# Patient Record
Sex: Male | Born: 1955 | Race: Black or African American | Hispanic: No | Marital: Married | Smoking: Former smoker
Health system: Southern US, Community
[De-identification: ages and names within clinical notes are randomized; demographics above are authoritative.]

## PROBLEM LIST (undated history)

## (undated) DIAGNOSIS — I10 Essential (primary) hypertension: Secondary | ICD-10-CM

## (undated) DIAGNOSIS — M1990 Unspecified osteoarthritis, unspecified site: Secondary | ICD-10-CM

## (undated) HISTORY — PX: VASECTOMY: SHX75

---

## 2004-06-26 ENCOUNTER — Emergency Department (HOSPITAL_COMMUNITY): Admission: EM | Admit: 2004-06-26 | Discharge: 2004-06-26 | Payer: Self-pay | Admitting: Emergency Medicine

## 2009-03-16 ENCOUNTER — Emergency Department (HOSPITAL_COMMUNITY): Admission: EM | Admit: 2009-03-16 | Discharge: 2009-03-16 | Payer: Self-pay | Admitting: Emergency Medicine

## 2009-03-23 ENCOUNTER — Emergency Department (HOSPITAL_COMMUNITY): Admission: EM | Admit: 2009-03-23 | Discharge: 2009-03-23 | Payer: Self-pay | Admitting: Emergency Medicine

## 2010-08-05 LAB — DIFFERENTIAL
Basophils Relative: 0 % (ref 0–1)
Eosinophils Absolute: 0 10*3/uL (ref 0.0–0.7)
Monocytes Absolute: 1 10*3/uL (ref 0.1–1.0)
Neutrophils Relative %: 83 % — ABNORMAL HIGH (ref 43–77)

## 2010-08-05 LAB — CSF CELL COUNT WITH DIFFERENTIAL
RBC Count, CSF: 444 /mm3 — ABNORMAL HIGH
Tube #: 1
WBC, CSF: 6 /mm3 — ABNORMAL HIGH (ref 0–5)

## 2010-08-05 LAB — CSF CULTURE W GRAM STAIN
Culture: NO GROWTH
Gram Stain: NONE SEEN

## 2010-08-05 LAB — BASIC METABOLIC PANEL
Calcium: 8.6 mg/dL (ref 8.4–10.5)
Creatinine, Ser: 1.39 mg/dL (ref 0.4–1.5)
GFR calc Af Amer: >60 mL/min (ref >60)
GFR calc non Af Amer: 54 mL/min — ABNORMAL LOW (ref >60)
Glucose, Bld: 131 mg/dL — ABNORMAL HIGH (ref 70–99)
Potassium: 3.8 mEq/L (ref 3.5–5.1)
Sodium: 128 mEq/L — ABNORMAL LOW (ref 135–145)

## 2010-08-05 LAB — URINALYSIS, ROUTINE W REFLEX MICROSCOPIC
Bilirubin Urine: NEGATIVE
Glucose, UA: NEGATIVE mg/dL
Hgb urine dipstick: NEGATIVE
Nitrite: NEGATIVE
Protein, ur: NEGATIVE mg/dL
Specific Gravity, Urine: 1.01 (ref 1.005–1.030)
pH: 7 (ref 5.0–8.0)

## 2010-08-05 LAB — CBC
HCT: 43.6 % (ref 39.0–52.0)
Hemoglobin: 15 g/dL (ref 13.0–17.0)
RDW: 13.3 % (ref 11.5–15.5)
WBC: 12.3 10*3/uL — ABNORMAL HIGH (ref 4.0–10.5)

## 2010-08-05 LAB — GRAM STAIN: Gram Stain: NONE SEEN

## 2010-08-05 LAB — PROTEIN, CSF: Total  Protein, CSF: 25 mg/dL (ref 15–45)

## 2012-04-10 ENCOUNTER — Emergency Department (HOSPITAL_COMMUNITY)
Admission: EM | Admit: 2012-04-10 | Discharge: 2012-04-11 | Disposition: A | Discharge status: Home / Self Care | Payer: No Typology Code available for payment source | Attending: Emergency Medicine | Admitting: Emergency Medicine

## 2012-04-10 ENCOUNTER — Emergency Department (HOSPITAL_COMMUNITY): Payer: No Typology Code available for payment source

## 2012-04-10 ENCOUNTER — Encounter (HOSPITAL_COMMUNITY): Payer: Self-pay | Admitting: *Deleted

## 2012-04-10 DIAGNOSIS — M129 Arthropathy, unspecified: Secondary | ICD-10-CM | POA: Diagnosis not present

## 2012-04-10 DIAGNOSIS — M79609 Pain in unspecified limb: Secondary | ICD-10-CM | POA: Insufficient documentation

## 2012-04-10 DIAGNOSIS — I10 Essential (primary) hypertension: Secondary | ICD-10-CM | POA: Insufficient documentation

## 2012-04-10 DIAGNOSIS — M792 Neuralgia and neuritis, unspecified: Secondary | ICD-10-CM

## 2012-04-10 DIAGNOSIS — M25519 Pain in unspecified shoulder: Secondary | ICD-10-CM | POA: Diagnosis present

## 2012-04-10 HISTORY — DX: Essential (primary) hypertension: I10

## 2012-04-10 HISTORY — DX: Unspecified osteoarthritis, unspecified site: M19.90

## 2012-04-10 MED ORDER — LORAZEPAM 1 MG PO TABS
1.0000 mg | ORAL_TABLET | Freq: Once | ORAL | Status: AC
  Administered 2012-04-11: 1 mg via ORAL
  Filled 2012-04-10: qty 1

## 2012-04-10 MED ORDER — HYDROCODONE-ACETAMINOPHEN 5-325 MG PO TABS
1.0000 | ORAL_TABLET | Freq: Once | ORAL | Status: DC
  Filled 2012-04-10: qty 1

## 2012-04-10 NOTE — ED Provider Notes (Signed)
History     CSN: [id number]  Arrival date & time 04/10/12  2111   First MD Initiated Contact with Patient 04/10/12 2312      No chief complaint on file.   (Consider location/radiation/quality/duration/timing/severity/associated sxs/prior treatment) Patient is a 56 y.o. male presenting with extremity pain. The history is provided by the patient.  Extremity Pain This is a new (new onset of upper right arm pain between the elbow and shoulder ) problem. The current episode started today. The problem occurs intermittently. The problem has been gradually worsening. Pertinent negatives include no abdominal pain, arthralgias, chest pain, fever, headaches, joint swelling, neck pain or numbness. Exacerbated by: the pain is aggravated by lowering the arm below 90 degrees  flexion or abduction. He has tried immobilization (the symptoms seem to dissipate when the arm is flexed abvove the head) for the symptoms.    Past Medical History  Diagnosis Date  . Hypertension   . Arthritis     History reviewed. No pertinent past surgical history.  No family history on file.  History  Substance Use Topics  . Smoking status: Never Smoker   . Smokeless tobacco: Not on file  . Alcohol Use: No      Review of Systems  Constitutional: Negative for fever.  HENT: Negative for neck pain.   Respiratory: Negative for chest tightness and shortness of breath.   Cardiovascular: Negative for chest pain.  Gastrointestinal: Negative for abdominal pain.  Musculoskeletal: Negative for back pain, joint swelling and arthralgias.       See HPI for arm symptom description.  Neurological: Negative for numbness and headaches.    Allergies  Review of patient's allergies indicates no known allergies.  Home Medications   Current Outpatient Rx  Name  Route  Sig  Dispense  Refill  . ACETAMINOPHEN 500 MG PO TABS   Oral   Take 1,000 mg by mouth every 6 (six) hours as needed. For pain.         Marland Kitchen  HYDROCHLOROTHIAZIDE 25 MG PO TABS   Oral   Take 25 mg by mouth daily.           BP 137/78  Pulse 82  Temp 97.6 F (36.4 C) (Oral)  Resp 20  SpO2 100%  Physical Exam  Constitutional: He appears well-developed and well-nourished.  Neck: Normal range of motion. Muscular tenderness present.       Tenderness upon palpation left of center cervical spine.  Cardiovascular: Normal rate and regular rhythm.   Pulses:      Radial pulses are 2+ on the left side.  Pulmonary/Chest: Breath sounds normal.  Musculoskeletal:       Left upper arm: He exhibits tenderness. He exhibits no swelling and no deformity.       He exhibits no neurological deficits in the left arm.  No muscle weakness noted.    ED Course  Procedures (including critical care time)  Labs Reviewed - No data to display Dg Shoulder Left  04/10/2012  *RADIOLOGY REPORT*  Clinical Data: Left shoulder pain.  No injury.  The patient is unable to lower the arm due to pain.  LEFT SHOULDER - 2+ VIEW  Comparison: None.  Findings: Two-view study obtained with limited positioning.  As visualized, there is no evidence of acute fracture or subluxation. No focal bone lesion or bone destruction.  Bone cortex and trabecular architecture appear intact.  IMPRESSION: Nonstandard imaging projections of the left shoulder demonstrate no gross evidence of acute fracture or disc  Original Report Authenticated By: Burman Nieves, M.D.      No diagnosis found. 1. Cervical radiculopathy   MDM  Patient presents with non musculoskeletal pain in his left upper arm with movement.  The pain is intermittent and described as 15/10.  When the arm is flexed above his head, the pain dissipates.  The patient struggles to find a position that alleviates the pain.  He states that Saturday, he felt a "twinge" of this pain, but Monday morning the pain exacerbated without warning.  He describes the pain as a charlie horse or funny bone feeling.   The pain follows a  radicular type pattern in the left arm from the shoulder to the wrist distally.  Patient given dilaudid and lorazepam in ED, which helped reduce his pain symptoms.  He will be discharged on percocet and prednisone with a referral to orthopedic surgeon and/or neurosurgery.  He should follow up with one of these referrals as soon as possible.  The patient acknowledges and agrees with this treatment plan.        Edna Rede, PA-C 04/11/12 0125

## 2012-04-10 NOTE — ED Notes (Signed)
Pt c/o left shoulder pain since Saturday; no known injury; sitting with arm above his head stating this is only comfortable position

## 2012-04-11 DIAGNOSIS — M79609 Pain in unspecified limb: Secondary | ICD-10-CM | POA: Diagnosis not present

## 2012-04-11 MED ORDER — PREDNISONE 20 MG PO TABS: ORAL_TABLET | ORAL | Status: DC

## 2012-04-11 MED ORDER — PREDNISONE 20 MG PO TABS
60.0000 mg | ORAL_TABLET | Freq: Once | ORAL | Status: AC
  Administered 2012-04-11: 60 mg via ORAL
  Filled 2012-04-11: qty 3

## 2012-04-11 MED ORDER — HYDROMORPHONE HCL PF 2 MG/ML IJ SOLN
2.0000 mg | Freq: Once | INTRAMUSCULAR | Status: AC
  Administered 2012-04-11: 2 mg via INTRAMUSCULAR
  Filled 2012-04-11: qty 1

## 2012-04-11 MED ORDER — OXYCODONE-ACETAMINOPHEN 5-325 MG PO TABS: 1.0000 | ORAL_TABLET | ORAL | Status: DC | PRN

## 2012-04-11 NOTE — ED Provider Notes (Signed)
Medical screening examination/treatment/procedure(s) were performed by non-physician practitioner and as supervising physician I was immediately available for consultation/collaboration.   Lyanne Co, MD 04/11/12 [phone]

## 2012-04-11 NOTE — ED Notes (Addendum)
Pt noted to be pacing in room with c/o pain.

## 2012-05-29 ENCOUNTER — Other Ambulatory Visit: Payer: Self-pay | Admitting: Neurosurgery

## 2012-05-29 DIAGNOSIS — M542 Cervicalgia: Secondary | ICD-10-CM

## 2012-06-05 ENCOUNTER — Other Ambulatory Visit: Payer: TRICARE For Life (TFL)

## 2012-06-16 ENCOUNTER — Ambulatory Visit (HOSPITAL_COMMUNITY): Admission: RE | Admit: 2012-06-16 | Source: Ambulatory Visit

## 2012-06-22 ENCOUNTER — Ambulatory Visit (HOSPITAL_COMMUNITY)
Admission: RE | Admit: 2012-06-22 | Discharge: 2012-06-22 | Disposition: A | Discharge status: Home / Self Care | Source: Ambulatory Visit | Attending: Neurosurgery | Admitting: Neurosurgery

## 2012-06-22 DIAGNOSIS — M79609 Pain in unspecified limb: Secondary | ICD-10-CM | POA: Insufficient documentation

## 2012-06-22 DIAGNOSIS — M542 Cervicalgia: Secondary | ICD-10-CM | POA: Insufficient documentation

## 2012-06-22 DIAGNOSIS — R29898 Other symptoms and signs involving the musculoskeletal system: Secondary | ICD-10-CM | POA: Insufficient documentation

## 2012-06-22 DIAGNOSIS — M5020 Other cervical disc displacement, unspecified cervical region: Secondary | ICD-10-CM | POA: Insufficient documentation

## 2012-06-22 DIAGNOSIS — M5000 Cervical disc disorder with myelopathy, unspecified cervical region: Secondary | ICD-10-CM | POA: Insufficient documentation

## 2012-06-22 DIAGNOSIS — R209 Unspecified disturbances of skin sensation: Secondary | ICD-10-CM | POA: Insufficient documentation

## 2012-11-23 ENCOUNTER — Other Ambulatory Visit: Payer: Self-pay | Admitting: Neurosurgery

## 2013-02-09 ENCOUNTER — Encounter (HOSPITAL_COMMUNITY): Payer: Self-pay | Admitting: Pharmacy Technician

## 2013-02-12 ENCOUNTER — Encounter (HOSPITAL_COMMUNITY): Payer: Self-pay

## 2013-02-12 ENCOUNTER — Encounter (HOSPITAL_COMMUNITY)
Admission: RE | Admit: 2013-02-12 | Discharge: 2013-02-12 | Disposition: A | Discharge status: Home / Self Care | Source: Ambulatory Visit | Attending: Neurosurgery | Admitting: Neurosurgery

## 2013-02-12 ENCOUNTER — Ambulatory Visit (HOSPITAL_COMMUNITY)
Admission: RE | Admit: 2013-02-12 | Discharge: 2013-02-12 | Disposition: A | Discharge status: Home / Self Care | Source: Ambulatory Visit | Attending: Anesthesiology | Admitting: Anesthesiology

## 2013-02-12 DIAGNOSIS — I10 Essential (primary) hypertension: Secondary | ICD-10-CM | POA: Insufficient documentation

## 2013-02-12 DIAGNOSIS — Z01810 Encounter for preprocedural cardiovascular examination: Secondary | ICD-10-CM | POA: Insufficient documentation

## 2013-02-12 DIAGNOSIS — Z01812 Encounter for preprocedural laboratory examination: Secondary | ICD-10-CM | POA: Insufficient documentation

## 2013-02-12 DIAGNOSIS — Z01818 Encounter for other preprocedural examination: Secondary | ICD-10-CM | POA: Insufficient documentation

## 2013-02-12 DIAGNOSIS — R918 Other nonspecific abnormal finding of lung field: Secondary | ICD-10-CM | POA: Insufficient documentation

## 2013-02-12 LAB — SURGICAL PCR SCREEN
MRSA, PCR: NEGATIVE
Staphylococcus aureus: NEGATIVE

## 2013-02-12 LAB — BASIC METABOLIC PANEL
BUN: 12 mg/dL (ref 6–23)
Calcium: 9 mg/dL (ref 8.4–10.5)
Creatinine, Ser: 1.12 mg/dL (ref 0.50–1.35)
GFR calc Af Amer: 83 mL/min — ABNORMAL LOW (ref >90)
GFR calc non Af Amer: 72 mL/min — ABNORMAL LOW (ref >90)

## 2013-02-12 LAB — CBC
MCHC: 33.2 g/dL (ref 30.0–36.0)
Platelets: 216 10*3/uL (ref 150–400)
RBC: 4.71 MIL/uL (ref 4.22–5.81)
RDW: 13.3 % (ref 11.5–15.5)

## 2013-02-12 NOTE — Pre-Procedure Instructions (Signed)
Alexander Rangel  02/12/2013   Your procedure is scheduled on:  October 23  Report to [facility] Entrance "A" [address] at [employer] AM.  Call this number if you have problems the morning of surgery: [phone]   Remember:   Do not eat food or drink liquids after midnight.   Take these medicines the morning of surgery with A SIP OF WATER: None   STOP Aspirin and Multiple Vitamins October 16  Do not take Aspirin, Aleve, Naproxen, Advil, Ibuprofen, Vitamin, Herbs, or Supplements starting today  Do not wear jewelry, make-up or nail polish.  Do not wear lotions, powders, or perfumes. You may wear deodorant.  Do not shave 48 hours prior to surgery. Men may shave face and neck.  Do not bring valuables to the hospital.  [facility] is not responsible                  for any belongings or valuables.               Contacts, dentures or bridgework may not be worn into surgery.  Leave suitcase in the car. After surgery it may be brought to your room.  For patients admitted to the hospital, discharge time is determined by your                treatment team.               Special Instructions: Shower using CHG 2 nights before surgery and the night before surgery.  If you shower the day of surgery use CHG.  Use special wash - you have one bottle of CHG for all showers.  You should use approximately 1/3 of the bottle for each shower.   Please read over the following fact sheets that you were given: Pain Booklet, Coughing and Deep Breathing and Surgical Site Infection Prevention

## 2013-02-12 NOTE — Progress Notes (Signed)
Requested Stress test, echo and cardiac records from [facility].

## 2013-02-13 NOTE — Progress Notes (Signed)
Anesthesia Chart Review:  Patient is a 57 year old male scheduled for C5-6, C6-7 ACDF on 02/22/13 by Dr. Lovell Sheehan.  History includes former smoker, HTN, arthritis, vasectomy.  EKG on 02/12/13 showed NSR.  Cardiac testing results reviewed for the [facility]. On 05/25/12 he had a normal treadmill stress EKG.  On 10/27/11 he had an echocardiogram that showed normal LV size and function, EF 70%. Normal RV size and function. Trace MR.  EKG on 04/18/12 showed NSR, minimal voltage criteria for LVH.  CXR on 02/12/13 showed: 1. No acute cardiopulmonary disease.  2. Indeterminate approximately 1.8 x 0.8 cm nodule overlying the peripheral aspect of the right mid lung may represent a discrete pulmonary nodule or be associated with the adjacent pleura or lateral aspect of the 8th rib. Correlation to prior examinations (if available) is recommended. If no comparison exams, further evaluation with chest CT is recommended.  This report was called to Darl Pikes at Dr. Lovell Sheehan' office.  She will have him review for further recommendations.    Preoperative labs noted.  Defer timing of further evaluation of abnormal CXR to Dr. Lovell Sheehan.  If patient is not having any acute respiratory symptoms then I do not think that it will interfere with proceeding with plans for surgery.  Velna Ochs [facility] Short Stay Center/Anesthesiology Phone [phone] 02/13/2013 3:22 PM

## 2013-02-14 ENCOUNTER — Other Ambulatory Visit (HOSPITAL_COMMUNITY): Payer: Self-pay | Admitting: Neurosurgery

## 2013-02-14 DIAGNOSIS — R911 Solitary pulmonary nodule: Secondary | ICD-10-CM

## 2013-02-16 ENCOUNTER — Ambulatory Visit (HOSPITAL_COMMUNITY)
Admission: RE | Admit: 2013-02-16 | Discharge: 2013-02-16 | Disposition: A | Discharge status: Home / Self Care | Source: Ambulatory Visit | Attending: Neurosurgery | Admitting: Neurosurgery

## 2013-02-16 DIAGNOSIS — R911 Solitary pulmonary nodule: Secondary | ICD-10-CM

## 2013-02-16 DIAGNOSIS — M899 Disorder of bone, unspecified: Secondary | ICD-10-CM | POA: Insufficient documentation

## 2013-02-21 MED ORDER — CEFAZOLIN SODIUM-DEXTROSE 2-3 GM-% IV SOLR
2.0000 g | INTRAVENOUS | Status: AC
  Administered 2013-02-22: 2 g via INTRAVENOUS
  Filled 2013-02-21: qty 50

## 2013-02-22 ENCOUNTER — Encounter (HOSPITAL_COMMUNITY)
Admission: RE | Disposition: A | Discharge status: Home / Self Care | Payer: Self-pay | Source: Ambulatory Visit | Attending: Neurosurgery

## 2013-02-22 ENCOUNTER — Encounter (HOSPITAL_COMMUNITY): Payer: Self-pay | Admitting: *Deleted

## 2013-02-22 ENCOUNTER — Ambulatory Visit (HOSPITAL_COMMUNITY): Payer: No Typology Code available for payment source | Admitting: Anesthesiology

## 2013-02-22 ENCOUNTER — Encounter (HOSPITAL_COMMUNITY): Payer: No Typology Code available for payment source | Admitting: Vascular Surgery

## 2013-02-22 ENCOUNTER — Observation Stay (HOSPITAL_COMMUNITY)
Admission: RE | Admit: 2013-02-22 | Discharge: 2013-02-23 | Disposition: A | Discharge status: Home / Self Care | Payer: No Typology Code available for payment source | Source: Ambulatory Visit | Attending: Neurosurgery | Admitting: Neurosurgery

## 2013-02-22 ENCOUNTER — Ambulatory Visit (HOSPITAL_COMMUNITY): Payer: No Typology Code available for payment source

## 2013-02-22 DIAGNOSIS — G935 Compression of brain: Secondary | ICD-10-CM | POA: Diagnosis not present

## 2013-02-22 DIAGNOSIS — M47812 Spondylosis without myelopathy or radiculopathy, cervical region: Secondary | ICD-10-CM | POA: Diagnosis not present

## 2013-02-22 DIAGNOSIS — M5030 Other cervical disc degeneration, unspecified cervical region: Secondary | ICD-10-CM | POA: Diagnosis not present

## 2013-02-22 DIAGNOSIS — M5020 Other cervical disc displacement, unspecified cervical region: Secondary | ICD-10-CM | POA: Diagnosis not present

## 2013-02-22 DIAGNOSIS — Z79899 Other long term (current) drug therapy: Secondary | ICD-10-CM | POA: Diagnosis not present

## 2013-02-22 DIAGNOSIS — I10 Essential (primary) hypertension: Secondary | ICD-10-CM | POA: Diagnosis not present

## 2013-02-22 DIAGNOSIS — M4712 Other spondylosis with myelopathy, cervical region: Secondary | ICD-10-CM

## 2013-02-22 HISTORY — PX: ANTERIOR CERVICAL DECOMP/DISCECTOMY FUSION: SHX1161

## 2013-02-22 SURGERY — ANTERIOR CERVICAL DECOMPRESSION/DISCECTOMY FUSION 2 LEVELS
Anesthesia: General | Site: Neck | Wound class: Clean

## 2013-02-22 MED ORDER — ONDANSETRON HCL 4 MG/2ML IJ SOLN
INTRAMUSCULAR | Status: DC | PRN
  Administered 2013-02-22: 4 mg via INTRAVENOUS

## 2013-02-22 MED ORDER — LACTATED RINGERS IV SOLN: INTRAVENOUS | Status: DC

## 2013-02-22 MED ORDER — HYDROCODONE-ACETAMINOPHEN 5-325 MG PO TABS: 1.0000 | ORAL_TABLET | ORAL | Status: DC | PRN

## 2013-02-22 MED ORDER — OXYCODONE-ACETAMINOPHEN 5-325 MG PO TABS
1.0000 | ORAL_TABLET | ORAL | Status: DC | PRN
  Administered 2013-02-22 – 2013-02-23 (×4): 2 via ORAL
  Filled 2013-02-22 (×3): qty 2

## 2013-02-22 MED ORDER — DEXAMETHASONE SODIUM PHOSPHATE 4 MG/ML IJ SOLN
INTRAMUSCULAR | Status: DC | PRN
  Administered 2013-02-22: 10 mg via INTRAVENOUS

## 2013-02-22 MED ORDER — PROMETHAZINE HCL 25 MG/ML IJ SOLN: 6.2500 mg | INTRAMUSCULAR | Status: DC | PRN

## 2013-02-22 MED ORDER — EPHEDRINE SULFATE 50 MG/ML IJ SOLN
INTRAMUSCULAR | Status: DC | PRN
  Administered 2013-02-22: 10 mg via INTRAVENOUS

## 2013-02-22 MED ORDER — NEOSTIGMINE METHYLSULFATE 1 MG/ML IJ SOLN
INTRAMUSCULAR | Status: DC | PRN
  Administered 2013-02-22: 3 mg via INTRAVENOUS

## 2013-02-22 MED ORDER — LACTATED RINGERS IV SOLN
INTRAVENOUS | Status: DC | PRN
  Administered 2013-02-22 (×2): via INTRAVENOUS

## 2013-02-22 MED ORDER — FENTANYL CITRATE 0.05 MG/ML IJ SOLN
INTRAMUSCULAR | Status: DC | PRN
  Administered 2013-02-22: 100 ug via INTRAVENOUS
  Administered 2013-02-22: 150 ug via INTRAVENOUS
  Administered 2013-02-22 (×2): 50 ug via INTRAVENOUS

## 2013-02-22 MED ORDER — ACETAMINOPHEN 650 MG RE SUPP: 650.0000 mg | RECTAL | Status: DC | PRN

## 2013-02-22 MED ORDER — HYDROMORPHONE HCL PF 1 MG/ML IJ SOLN
0.2500 mg | INTRAMUSCULAR | Status: DC | PRN
  Administered 2013-02-22 (×2): 0.5 mg via INTRAVENOUS

## 2013-02-22 MED ORDER — GLYCOPYRROLATE 0.2 MG/ML IJ SOLN
INTRAMUSCULAR | Status: DC | PRN
  Administered 2013-02-22: 0.4 mg via INTRAVENOUS

## 2013-02-22 MED ORDER — HEMOSTATIC AGENTS (NO CHARGE) OPTIME
TOPICAL | Status: DC | PRN
  Administered 2013-02-22: 1 via TOPICAL

## 2013-02-22 MED ORDER — HYDROMORPHONE HCL PF 1 MG/ML IJ SOLN
INTRAMUSCULAR | Status: AC
  Administered 2013-02-22: 0.5 mg via INTRAVENOUS
  Filled 2013-02-22: qty 1

## 2013-02-22 MED ORDER — ACETAMINOPHEN 325 MG PO TABS: 650.0000 mg | ORAL_TABLET | ORAL | Status: DC | PRN

## 2013-02-22 MED ORDER — DEXAMETHASONE SODIUM PHOSPHATE 4 MG/ML IJ SOLN
4.0000 mg | Freq: Four times a day (QID) | INTRAMUSCULAR | Status: AC
  Administered 2013-02-22: 4 mg via INTRAVENOUS
  Filled 2013-02-22: qty 1

## 2013-02-22 MED ORDER — BUPIVACAINE-EPINEPHRINE PF 0.5-1:200000 % IJ SOLN
INTRAMUSCULAR | Status: DC | PRN
  Administered 2013-02-22: 10 mL

## 2013-02-22 MED ORDER — SODIUM CHLORIDE 0.9 % IR SOLN
Status: DC | PRN
  Administered 2013-02-22: 08:00:00

## 2013-02-22 MED ORDER — CEFAZOLIN SODIUM-DEXTROSE 2-3 GM-% IV SOLR
2.0000 g | Freq: Three times a day (TID) | INTRAVENOUS | Status: AC
  Administered 2013-02-22 (×2): 2 g via INTRAVENOUS
  Filled 2013-02-22 (×2): qty 50

## 2013-02-22 MED ORDER — DEXAMETHASONE 4 MG PO TABS
4.0000 mg | ORAL_TABLET | Freq: Four times a day (QID) | ORAL | Status: AC
  Administered 2013-02-22 (×2): 4 mg via ORAL
  Filled 2013-02-22 (×2): qty 1

## 2013-02-22 MED ORDER — MEPERIDINE HCL 25 MG/ML IJ SOLN: 6.2500 mg | INTRAMUSCULAR | Status: DC | PRN

## 2013-02-22 MED ORDER — DIAZEPAM 5 MG PO TABS
5.0000 mg | ORAL_TABLET | Freq: Four times a day (QID) | ORAL | Status: DC | PRN
  Administered 2013-02-22 – 2013-02-23 (×3): 5 mg via ORAL
  Filled 2013-02-22 (×2): qty 1

## 2013-02-22 MED ORDER — HYDROCHLOROTHIAZIDE 25 MG PO TABS
25.0000 mg | ORAL_TABLET | Freq: Every day | ORAL | Status: DC
  Administered 2013-02-22: 25 mg via ORAL
  Filled 2013-02-22 (×2): qty 1

## 2013-02-22 MED ORDER — LISINOPRIL 20 MG PO TABS
20.0000 mg | ORAL_TABLET | Freq: Every day | ORAL | Status: DC
  Administered 2013-02-22: 20 mg via ORAL
  Filled 2013-02-22 (×2): qty 1

## 2013-02-22 MED ORDER — ONDANSETRON HCL 4 MG/2ML IJ SOLN: 4.0000 mg | INTRAMUSCULAR | Status: DC | PRN

## 2013-02-22 MED ORDER — 0.9 % SODIUM CHLORIDE (POUR BTL) OPTIME
TOPICAL | Status: DC | PRN
  Administered 2013-02-22: 1000 mL

## 2013-02-22 MED ORDER — OXYCODONE HCL 5 MG PO TABS: 5.0000 mg | ORAL_TABLET | Freq: Once | ORAL | Status: DC | PRN

## 2013-02-22 MED ORDER — DIAZEPAM 5 MG PO TABS
ORAL_TABLET | ORAL | Status: AC
  Administered 2013-02-22: 5 mg via ORAL
  Filled 2013-02-22: qty 1

## 2013-02-22 MED ORDER — MENTHOL 3 MG MT LOZG
1.0000 | LOZENGE | OROMUCOSAL | Status: DC | PRN
  Filled 2013-02-22: qty 9

## 2013-02-22 MED ORDER — MIDAZOLAM HCL 2 MG/2ML IJ SOLN: 0.5000 mg | Freq: Once | INTRAMUSCULAR | Status: DC | PRN

## 2013-02-22 MED ORDER — OXYCODONE-ACETAMINOPHEN 5-325 MG PO TABS
ORAL_TABLET | ORAL | Status: AC
  Administered 2013-02-22: 2 via ORAL
  Filled 2013-02-22: qty 2

## 2013-02-22 MED ORDER — OXYCODONE HCL 5 MG/5ML PO SOLN: 5.0000 mg | Freq: Once | ORAL | Status: DC | PRN

## 2013-02-22 MED ORDER — DOCUSATE SODIUM 100 MG PO CAPS
100.0000 mg | ORAL_CAPSULE | Freq: Two times a day (BID) | ORAL | Status: DC
  Administered 2013-02-22: 100 mg via ORAL
  Filled 2013-02-22: qty 1

## 2013-02-22 MED ORDER — BACITRACIN ZINC 500 UNIT/GM EX OINT
TOPICAL_OINTMENT | CUTANEOUS | Status: DC | PRN
  Administered 2013-02-22: 1 via TOPICAL

## 2013-02-22 MED ORDER — MIDAZOLAM HCL 5 MG/5ML IJ SOLN
INTRAMUSCULAR | Status: DC | PRN
  Administered 2013-02-22: 2 mg via INTRAVENOUS

## 2013-02-22 MED ORDER — ALUM & MAG HYDROXIDE-SIMETH 200-200-20 MG/5ML PO SUSP: 30.0000 mL | Freq: Four times a day (QID) | ORAL | Status: DC | PRN

## 2013-02-22 MED ORDER — ADULT MULTIVITAMIN W/MINERALS CH
1.0000 | ORAL_TABLET | Freq: Every day | ORAL | Status: DC
  Filled 2013-02-22 (×2): qty 1

## 2013-02-22 MED ORDER — MORPHINE SULFATE 2 MG/ML IJ SOLN: 1.0000 mg | INTRAMUSCULAR | Status: DC | PRN

## 2013-02-22 MED ORDER — LIDOCAINE HCL (CARDIAC) 20 MG/ML IV SOLN
INTRAVENOUS | Status: DC | PRN
  Administered 2013-02-22: 40 mg via INTRAVENOUS

## 2013-02-22 MED ORDER — PHENOL 1.4 % MT LIQD: 1.0000 | OROMUCOSAL | Status: DC | PRN

## 2013-02-22 MED ORDER — PROPOFOL 10 MG/ML IV BOLUS
INTRAVENOUS | Status: DC | PRN
  Administered 2013-02-22: 120 mg via INTRAVENOUS

## 2013-02-22 MED ORDER — THROMBIN 5000 UNITS EX SOLR
CUTANEOUS | Status: DC | PRN
  Administered 2013-02-22 (×2): 5000 [IU] via TOPICAL

## 2013-02-22 MED ORDER — ROCURONIUM BROMIDE 100 MG/10ML IV SOLN
INTRAVENOUS | Status: DC | PRN
  Administered 2013-02-22: 50 mg via INTRAVENOUS

## 2013-02-22 SURGICAL SUPPLY — 59 items
BAG DECANTER FOR FLEXI CONT (MISCELLANEOUS) ×2 IMPLANT
BENZOIN TINCTURE PRP APPL 2/3 (GAUZE/BANDAGES/DRESSINGS) ×2 IMPLANT
BIT DRILL NEURO 2X3.1 SFT TUCH (MISCELLANEOUS) ×1 IMPLANT
BLADE SURG 15 STRL LF DISP TIS (BLADE) IMPLANT
BLADE SURG 15 STRL SS (BLADE)
BLADE ULTRA TIP 2M (BLADE) ×2 IMPLANT
BRUSH SCRUB EZ PLAIN DRY (MISCELLANEOUS) ×2 IMPLANT
BUR BARREL STRAIGHT FLUTE 4.0 (BURR) ×2 IMPLANT
BUR MATCHSTICK NEURO 3.0 LAGG (BURR) ×2 IMPLANT
CANISTER SUCT 3000ML (MISCELLANEOUS) ×2 IMPLANT
CONT SPEC 4OZ CLIKSEAL STRL BL (MISCELLANEOUS) ×2 IMPLANT
COVER MAYO STAND STRL (DRAPES) ×2 IMPLANT
DRAPE LAPAROTOMY 100X72 PEDS (DRAPES) ×2 IMPLANT
DRAPE MICROSCOPE LEICA (MISCELLANEOUS) IMPLANT
DRAPE POUCH INSTRU U-SHP 10X18 (DRAPES) ×2 IMPLANT
DRAPE SURG 17X23 STRL (DRAPES) ×4 IMPLANT
DRILL NEURO 2X3.1 SOFT TOUCH (MISCELLANEOUS) ×2
ELECT REM PT RETURN 9FT ADLT (ELECTROSURGICAL) ×2
ELECTRODE REM PT RTRN 9FT ADLT (ELECTROSURGICAL) ×1 IMPLANT
GAUZE SPONGE 4X4 16PLY XRAY LF (GAUZE/BANDAGES/DRESSINGS) IMPLANT
GLOVE BIO SURGEON STRL SZ8.5 (GLOVE) ×2 IMPLANT
GLOVE BIOGEL M 8.0 STRL (GLOVE) ×2 IMPLANT
GLOVE BIOGEL PI IND STRL 8.5 (GLOVE) ×2 IMPLANT
GLOVE BIOGEL PI INDICATOR 8.5 (GLOVE) ×2
GLOVE EXAM NITRILE LRG STRL (GLOVE) IMPLANT
GLOVE EXAM NITRILE MD LF STRL (GLOVE) IMPLANT
GLOVE EXAM NITRILE XL STR (GLOVE) IMPLANT
GLOVE EXAM NITRILE XS STR PU (GLOVE) IMPLANT
GLOVE SS BIOGEL STRL SZ 8 (GLOVE) ×1 IMPLANT
GLOVE SUPERSENSE BIOGEL SZ 8 (GLOVE) ×1
GLOVE SURG SS PI 8.0 STRL IVOR (GLOVE) ×8 IMPLANT
GOWN BRE IMP SLV AUR LG STRL (GOWN DISPOSABLE) IMPLANT
GOWN BRE IMP SLV AUR XL STRL (GOWN DISPOSABLE) ×4 IMPLANT
GOWN STRL REIN 2XL LVL4 (GOWN DISPOSABLE) ×4 IMPLANT
KIT BASIN OR (CUSTOM PROCEDURE TRAY) ×2 IMPLANT
KIT ROOM TURNOVER OR (KITS) ×2 IMPLANT
MARKER SKIN DUAL TIP RULER LAB (MISCELLANEOUS) ×2 IMPLANT
NEEDLE HYPO 22GX1.5 SAFETY (NEEDLE) ×2 IMPLANT
NEEDLE SPNL 18GX3.5 QUINCKE PK (NEEDLE) ×2 IMPLANT
NS IRRIG 1000ML POUR BTL (IV SOLUTION) ×2 IMPLANT
PACK LAMINECTOMY NEURO (CUSTOM PROCEDURE TRAY) ×2 IMPLANT
PEEK VISTA 14X14X7MM (Peek) ×4 IMPLANT
PIN DISTRACTION 14MM (PIN) ×4 IMPLANT
PLATE ANT CERV XTEND 2 LV 26 (Plate) ×2 IMPLANT
PUTTY 2.5ML ACTIFUSE ABX (Putty) ×2 IMPLANT
RUBBERBAND STERILE (MISCELLANEOUS) IMPLANT
SCREW XTD VAR 4.2 SELF TAP (Screw) ×12 IMPLANT
SPONGE GAUZE 4X4 12PLY (GAUZE/BANDAGES/DRESSINGS) ×2 IMPLANT
SPONGE INTESTINAL PEANUT (DISPOSABLE) ×2 IMPLANT
SPONGE SURGIFOAM ABS GEL SZ50 (HEMOSTASIS) ×2 IMPLANT
STRIP CLOSURE SKIN 1/2X4 (GAUZE/BANDAGES/DRESSINGS) ×2 IMPLANT
SUT VIC AB 0 CT1 27 (SUTURE) ×1
SUT VIC AB 0 CT1 27XBRD ANTBC (SUTURE) ×1 IMPLANT
SUT VIC AB 3-0 SH 8-18 (SUTURE) ×4 IMPLANT
SYR 20ML ECCENTRIC (SYRINGE) ×2 IMPLANT
TAPE CLOTH SURG 4X10 WHT LF (GAUZE/BANDAGES/DRESSINGS) ×2 IMPLANT
TOWEL OR 17X24 6PK STRL BLUE (TOWEL DISPOSABLE) ×2 IMPLANT
TOWEL OR 17X26 10 PK STRL BLUE (TOWEL DISPOSABLE) ×2 IMPLANT
WATER STERILE IRR 1000ML POUR (IV SOLUTION) ×2 IMPLANT

## 2013-02-22 NOTE — Plan of Care (Signed)
Problem: Consults Goal: Diagnosis - Spinal Surgery Outcome: Completed/Met Date Met:  02/22/13 Cervical Spine Fusion

## 2013-02-22 NOTE — Progress Notes (Signed)
Orthopedic Tech Progress Note Patient Details:  Alexander Rangel 1955/09/09 [id number] Called [employer] and ordered Aspen cervical collar. Patient ID: Alexander Rangel, male   DOB: [birth year], 57 y.o.   MRN: [id number]   [name] 02/22/2013, 12:17 PM

## 2013-02-22 NOTE — Anesthesia Preprocedure Evaluation (Signed)
Anesthesia Evaluation  Patient identified by MRN, date of birth, ID band Patient awake    Reviewed: Allergy & Precautions, H&P , NPO status , Patient's Chart, lab work & pertinent test results  History of Anesthesia Complications Negative for: history of anesthetic complications  Airway Mallampati: I TM Distance: >3 FB Neck ROM: Full    Dental  (+) Dental Advisory Given   Pulmonary COPDformer smoker (quit 14 years ago),  breath sounds clear to auscultation  Pulmonary exam normal       Cardiovascular hypertension, Pt. on medications Rhythm:Regular Rate:Normal  '13 ECHO: normal LVF, normal valves 1/14 Stress: normal perfusion with exercise   Neuro/Psych negative neurological ROS     GI/Hepatic negative GI ROS, Neg liver ROS,   Endo/Other  negative endocrine ROS  Renal/GU negative Renal ROS     Musculoskeletal   Abdominal   Peds  Hematology negative hematology ROS (+)   Anesthesia Other Findings   Reproductive/Obstetrics                           Anesthesia Physical Anesthesia Plan  ASA: II  Anesthesia Plan: General   Post-op Pain Management:    Induction: Intravenous  Airway Management Planned: Oral ETT  Additional Equipment:   Intra-op Plan:   Post-operative Plan: Extubation in OR  Informed Consent: I have reviewed the patients History and Physical, chart, labs and discussed the procedure including the risks, benefits and alternatives for the proposed anesthesia with the patient or authorized representative who has indicated his/her understanding and acceptance.     Plan Discussed with: Surgeon and CRNA  Anesthesia Plan Comments: (Plan routine monitors, GETA)        Anesthesia Quick Evaluation

## 2013-02-22 NOTE — H&P (Signed)
Subjective: The patient is a 57 year old black male who is complaining of neck with left shoulder and arm pain. He has failed medical management and was worked up with a cervical MRI. 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 We discussed the various treatment options including surgery. The patient has weighed the risks, benefits, and alternatives surgery and decided proceed with a C5-6 and C6-7 anterior cervicectomy, fusion, and plating. We are going to observe his Chiari malformation.  Past Medical History  Diagnosis Date  . Arthritis   . Hypertension     Cardiologist at [facility]    Past Surgical History  Procedure Laterality Date  . Vasectomy      No Known Allergies  History  Substance Use Topics  . Smoking status: Former Smoker -- 1.00 packs/day for 20 years    Quit date: 05/04/1995  . Smokeless tobacco: Never Used  . Alcohol Use: Yes     Comment: Couple of beers on the weekend    History reviewed. No pertinent family history. Prior to Admission medications   Medication Sig Start Date End Date Taking? Authorizing Provider  aspirin EC 81 MG tablet Take 81 mg by mouth daily.   Yes Historical Provider, MD  hydrochlorothiazide (HYDRODIURIL) 25 MG tablet Take 25 mg by mouth daily.   Yes Historical Provider, MD  lisinopril (PRINIVIL,ZESTRIL) 20 MG tablet Take 20 mg by mouth daily.   Yes Historical Provider, MD  Multiple Vitamin (MULTIVITAMIN WITH MINERALS) TABS tablet Take 1 tablet by mouth daily.   Yes Historical Provider, MD     Review of Systems  Positive ROS: As above  All other systems have been reviewed and were otherwise negative with the exception of those mentioned in the HPI and as above.  Objective: Vital signs in last 24 hours: Temp:  [97 F (36.1 C)] 97 F (36.1 C) (10/23 0556) Pulse Rate:  [60] 60 (10/23 0556) Resp:  [18] 18 (10/23 0556) BP: (143)/(69) 143/69 mmHg  (10/23 0556) SpO2:  [100 %] 100 % (10/23 0556)  General Appearance: Alert, cooperative, no distress, appears stated age Head: Normocephalic, without obvious abnormality, atraumatic Eyes: PERRL, conjunctiva/corneas clear, EOM's intact, fundi benign, both eyes      Ears: Normal TM's and external ear canals, both ears Throat: Lips, mucosa, and tongue normal; teeth and gums normal Neck: Supple, symmetrical, trachea midline, no adenopathy; thyroid: No enlargement/tenderness/nodules; no carotid bruit or JVD Back: Symmetric, no curvature, ROM normal, no CVA tenderness Lungs: Clear to auscultation bilaterally, respirations unlabored Heart: Regular rate and rhythm, S1 and S2 normal, no murmur, rub or gallop Abdomen: Soft, non-tender, bowel sounds active all four quadrants, no masses, no organomegaly Extremities: Extremities normal, atraumatic, no cyanosis or edema Pulses: 2+ and symmetric all extremities Skin: Skin color, texture, turgor normal, no rashes or lesions  NEUROLOGIC:   Mental status: alert and oriented, no aphasia, good attention span, Fund of knowledge/ memory ok Motor Exam - grossly normal Sensory Exam - grossly normal Reflexes:  Coordination - grossly normal Gait - grossly normal Balance - grossly normal Cranial Nerves: I: smell Not tested  II: visual acuity  OS: Normal    OD: Normal   II: visual fields Full to confrontation  II: pupils Equal, round, reactive to light  III,VII: ptosis None  III,IV,VI: extraocular muscles  Full ROM  V: mastication Normal  V: facial light touch sensation  Normal  V,VII: corneal reflex  Present  VII:  facial muscle function - upper  Normal  VII: facial muscle function - lower Normal  VIII: hearing Not tested  IX: soft palate elevation  Normal  IX,X: gag reflex Present  XI: trapezius strength  5/5  XI: sternocleidomastoid strength 5/5  XI: neck flexion strength  5/5  XII: tongue strength  Normal    Data Review Lab Results  Component  Value Date   WBC 6.3 02/12/2013   HGB 14.0 02/12/2013   HCT 42.2 02/12/2013   MCV 89.6 02/12/2013   PLT 216 02/12/2013   Lab Results  Component Value Date   NA 136 02/12/2013   K 3.5 02/12/2013   CL 100 02/12/2013   CO2 28 02/12/2013   BUN 12 02/12/2013   CREATININE 1.12 02/12/2013   GLUCOSE 112* 02/12/2013   No results found for this basename: INR, PROTIME    Assessment/Plan: 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 I reviewed his imaging studies with them and pointed out the abnormalities. We have discussed the various treatment options including surgery. I described the surgical treatment option of a C5-C6 and C6-7 anterior cervicectomy, fusion, and plating. I have shown him surgical models. We have discussed the risks, benefits, alternatives, and likelihood of achieving our goals with surgery. I have answered all the patient's questions. He has decided proceed with surgery.   Najat Olazabal D 02/22/2013 7:23 AM

## 2013-02-22 NOTE — Progress Notes (Signed)
Orthopedic Tech Progress Note Patient Details:  Alexander Rangel October 12, 1955 [id number] Brace order completed by [employer] vendor Patient ID: Alexander Rangel, male   DOB: [birth year], 57 y.o.   MRN: [id number]   [name] 02/22/2013, 3:42 PM

## 2013-02-22 NOTE — Progress Notes (Signed)
Patient ID: Alexander Rangel, male   DOB: [birth year], 57 y.o.   MRN: [id number] Subjective:  The patient is alert and pleasant. He looks well. He is in no apparent distress.  Objective: Vital signs in last 24 hours: Temp:  [97 F (36.1 C)-97.9 F (36.6 C)] 97.5 F (36.4 C) (10/23 1125) Pulse Rate:  [58-61] 61 (10/23 1125) Resp:  [16-22] 18 (10/23 1125) BP: (114-143)/(49-71) 114/71 mmHg (10/23 1125) SpO2:  [96 %-100 %] 96 % (10/23 1125)  Intake/Output from previous day:   Intake/Output this shift: Total I/O In: 1900 [I.V.:1900] Out: 200 [Blood:200]  Physical exam patient is alert and oriented. He is moving all 4 extremities well. His dressing is clean and dry. There is no evidence of hematoma or shift.  Lab Results: No results found for this basename: WBC, HGB, HCT, PLT,  in the last 72 hours BMET No results found for this basename: NA, K, CL, CO2, GLUCOSE, BUN, CREATININE, CALCIUM,  in the last 72 hours  Studies/Results: Dg Cervical Spine 2-3 Views  02/22/2013   CLINICAL DATA:  Lateral portable cervical spine view for surgical localization  EXAM: CERVICAL SPINE - 2-3 VIEW  COMPARISON:  Cervical MRI, 06/2012  FINDINGS: A surgical probe has its tip superimposed over the anterior C4-C5 disc.  IMPRESSION: Surgical localization image. Surgical probe tip at the C4-C5 level.   Electronically Signed   By: Amie Portland M.D.   On: 02/22/2013 10:50    Assessment/Plan: Patient is doing well. He will likely go home tomorrow.  LOS: 0 days     Conita Amenta D 02/22/2013, 2:45 PM

## 2013-02-22 NOTE — Anesthesia Postprocedure Evaluation (Signed)
  Anesthesia Post-op Note  Patient: Alexander Rangel  Procedure(s) Performed: Procedure(s) with comments: Cervical five-six, Cervical six-seven anterior cervical decompression with fusion interbody prothesis plating and bonegraft (N/A) - Cervical five-six, Cervical six-seven anterior cervical decompression with fusion interbody prothesis plating and bonegraft  Patient Location: PACU  Anesthesia Type:General  Level of Consciousness: awake, alert , oriented and patient cooperative  Airway and Oxygen Therapy: Patient Spontanous Breathing and Patient connected to nasal cannula oxygen  Post-op Pain: none  Post-op Assessment: Post-op Vital signs reviewed, Patient's Cardiovascular Status Stable, Respiratory Function Stable, Patent Airway, No signs of Nausea or vomiting and Pain level controlled  Post-op Vital Signs: Reviewed and stable  Complications: No apparent anesthesia complications

## 2013-02-22 NOTE — Transfer of Care (Signed)
Immediate Anesthesia Transfer of Care Note  Patient: Alexander Rangel  Procedure(s) Performed: Procedure(s) with comments: Cervical five-six, Cervical six-seven anterior cervical decompression with fusion interbody prothesis plating and bonegraft (N/A) - Cervical five-six, Cervical six-seven anterior cervical decompression with fusion interbody prothesis plating and bonegraft  Patient Location: PACU  Anesthesia Type:General  Level of Consciousness: awake, alert , oriented and patient cooperative  Airway & Oxygen Therapy: Patient Spontanous Breathing and Patient connected to nasal cannula oxygen  Post-op Assessment: Report given to PACU RN, Post -op Vital signs reviewed and stable and Patient moving all extremities  Post vital signs: Reviewed and stable  Complications: No apparent anesthesia complications

## 2013-02-22 NOTE — Op Note (Signed)
Brief history: The patient is a 57 year old black male who has had neck and radicular arm pain consistent with a cervical radiculopathy. He has failed medical management and was worked up with a cervical MRI. This demonstrated the patient had spondylosis and stenosis most, at C5-6 and C6-7. We discussed the various treatment options including surgery. The patient has weighed the risks, benefits, and alternatives surgery and decided proceed with a C5-C6 and C6-7 anterior cervical discectomy, fusion, and plating.  Patient also has a history of Chiari malformation but this is being observed.  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 Delma Officer  Asst.: Dr. Marikay Alar  Anesthesia: Gen. endotracheal  Estimated blood loss: 100 cc  Drains: None  Complications: None  Description of procedure: The patient was brought to the operating room by the anesthesia team. General endotracheal anesthesia was induced. A roll was placed under the patient's shoulders to keep the neck in the neutral position. The patient's anterior cervical region was then prepared with Betadine scrub and Betadine solution. Sterile drapes were applied.  The area to be incised was then injected with Marcaine with epinephrine solution. I then used a scalpel to make a transverse incision in the patient's left anterior neck. I used the Metzenbaum scissors to divide the platysmal muscle and then to dissect medial to the sternocleidomastoid muscle, jugular vein, and  carotid artery. I carefully dissected down towards the anterior cervical spine identifying the esophagus and retracting it medially. Then using Kitner swabs to clear soft tissue from the anterior cervical spine. We then inserted a bent spinal needle into the upper exposed intervertebral disc space. We then obtained intraoperative radiographs confirm our location.  I then used electrocautery to detach the medial border of the longus colli muscle bilaterally from the C5-6 and C6-7 intervertebral disc spaces. I then inserted the Caspar self-retaining retractor underneath the longus colli muscle bilaterally to provide exposure.  We then incised the intervertebral disc at C6-7. We then performed a partial intervertebral discectomy with a pituitary forceps and the Karlin curettes. I then inserted distraction screws into the vertebral bodies at C6 and C7. We then distracted the interspace. We then used the high-speed drill to decorticate the vertebral endplates at C6-7, to drill away the remainder of the intervertebral disc, to drill away some posterior spondylosis, and to thin out the posterior longitudinal ligament. I then incised ligament with the arachnoid knife. We then removed the ligament with a Kerrison punches undercutting the vertebral endplates and decompressing the thecal sac. We then performed foraminotomies about the bilateral C7 nerve roots. This completed the decompression at this level.  We then repeated this procedure in an analogous fashion at C5-C6 decompressing the thecal sac and the bile C6 nerve roots.  We now turned our to attention to the interbody fusion. We used the trial spacers to determine the appropriate size for the interbody prosthesis. We then pre-filled prosthesis with a combination of local morcellized autograft bone that we obtained during decompression as well as Actifuse bone graft extender. We then inserted the prosthesis into the distracted interspace at C5-6 and C6-7. We  then removed the distraction screws. There was  a good snug fit of the prosthesis in the interspace.   Having completed the fusion we now turned attention to the anterior spinal instrumentation. We used the high-speed drill to drill away some anterior spondylosis at the disc spaces so that the plate lay down flat. We selected the appropriate length titanium anterior cervical plate. We laid it along the anterior aspect of the vertebral bodies from C5-C7. We then drilled 14 mm holes at C5, C6 and C7. We then secured the plate to the vertebral bodies by placing two 14 mm self-tapping screws at C5, C6 and C7. We then obtained intraoperative radiograph. The demonstrating good position of the upper instrumentation. We cannot see the lower instrumentation well on the x-ray but it looked good in vivo. We therefore secured the screws the plate the locking each cam. This completed the instrumentation.  We then obtained hemostasis using bipolar electrocautery. We irrigated the wound out with bacitracin solution. We then removed the retractor. We inspected the esophagus for any damage. There was none apparent. We then reapproximated patient's platysmal muscle with interrupted 3-0 Vicryl suture. We then reapproximated the subcutaneous tissue with interrupted 3-0 Vicryl suture. The skin was reapproximated with Steri-Strips and benzoin. The wound was then covered with bacitracin ointment. A sterile dressing was applied. The drapes were removed. Patient was subsequently extubated by the anesthesia team and transported to the post anesthesia care unit in stable condition. All sponge instrument and needle counts were reportedly correct at the end of this case.

## 2013-02-23 ENCOUNTER — Encounter (HOSPITAL_COMMUNITY): Payer: Self-pay | Admitting: Neurosurgery

## 2013-02-23 DIAGNOSIS — M5030 Other cervical disc degeneration, unspecified cervical region: Secondary | ICD-10-CM | POA: Diagnosis not present

## 2013-02-23 MED ORDER — OXYCODONE-ACETAMINOPHEN 10-325 MG PO TABS: 1.0000 | ORAL_TABLET | ORAL | Status: AC | PRN

## 2013-02-23 MED ORDER — DSS 100 MG PO CAPS: 100.0000 mg | ORAL_CAPSULE | Freq: Two times a day (BID) | ORAL | Status: AC

## 2013-02-23 MED ORDER — DIAZEPAM 5 MG PO TABS: 5.0000 mg | ORAL_TABLET | Freq: Four times a day (QID) | ORAL | Status: AC | PRN

## 2013-02-23 NOTE — Discharge Summary (Signed)
Physician Discharge Summary  Patient ID: Alexander Rangel MRN: [id number] DOB/AGE: 1956-03-13 57 y.o.  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 *   Discharged Condition: good  Hospital Course: I performed a C5-6 and C6-7 anterior cervical discectomy, fusion, and plating on the patient on 02/22/2013. The surgery went well.  The patient's postoperative course was unremarkable. On postop day #1 he requested discharge to home. He was given oral and written discharge instructions. All his questions were answered.  Consults: None Significant Diagnostic Studies: None Treatments: C5-C6 and C6-C7 intracervical discectomy, fusion, and plating. Discharge Exam: Blood pressure 115/66, pulse 82, temperature 97.8 F (36.6 C), temperature source Oral, resp. rate 16, SpO2 98.00%. The patient is alert and pleasant. He looks well. His strength is normal in all 4 extremities. His dressing is clean and dry. There is no evidence of hematoma or shift.  Disposition: Home  Discharge Orders   Future Orders Complete By Expires   Call MD for:  difficulty breathing, headache or visual disturbances  As directed    Call MD for:  extreme fatigue  As directed    Call MD for:  hives  As directed    Call MD for:  persistant dizziness or light-headedness  As directed    Call MD for:  persistant nausea and vomiting  As directed    Call MD for:  redness, tenderness, or signs of infection (pain, swelling, redness, odor or green/yellow discharge around incision site)  As directed    Call MD for:  severe uncontrolled pain  As directed    Call MD for:  temperature >100.4  As directed    Diet - low sodium heart healthy  As directed    Discharge instructions  As directed    Comments:     Call [phone] for a followup  appointment. Take a stool softener while you are using pain medications.   Driving Restrictions  As directed    Comments:     Do not drive for 2 weeks.   Increase activity slowly  As directed    Lifting restrictions  As directed    Comments:     Do not lift more than 5 pounds. No excessive bending or twisting.   May shower / Bathe  As directed    Comments:     He may shower after the pain she is removed 3 days after surgery. Leave the incision alone.   Remove dressing in 48 hours  As directed    Comments:     Your stitches are under the scan and will dissolve by themselves. The Steri-Strips will fall off after you take a few showers. Do not rub back or pick at the wound, Leave the wound alone.       Medication List         aspirin EC 81 MG tablet  Take 81 mg by mouth daily.     diazepam 5 MG tablet  Commonly known as:  VALIUM  Take 1 tablet (5 mg total) by mouth every 6 (six) hours as needed.     DSS 100 MG Caps  Take 100 mg by mouth 2 (two) times daily.     hydrochlorothiazide 25 MG tablet  Commonly known as:  HYDRODIURIL  Take 25 mg by mouth daily.     lisinopril 20 MG tablet  Commonly known  as:  PRINIVIL,ZESTRIL  Take 20 mg by mouth daily.     multivitamin with minerals Tabs tablet  Take 1 tablet by mouth daily.     oxyCODONE-acetaminophen 10-325 MG per tablet  Commonly known as:  PERCOCET  Take 1 tablet by mouth every 4 (four) hours as needed for pain.         SignedCristi Loron 02/23/2013, 8:19 AM

## 2013-02-23 NOTE — Progress Notes (Signed)
Pt. Alert and oriented,follows simple instructions, denies pain. Incision area without swelling, redness or S/S of infection. Voiding adequate clear yellow urine. Moving all extremities well and vitals stable and documented. Patient discharged home with spouse. Anterior cervical fusion surgery notes instructions given to patient and family member for home safety and precautions. Pt. and family stated understanding of instructions given.  

## 2013-04-24 ENCOUNTER — Other Ambulatory Visit (HOSPITAL_COMMUNITY): Payer: Self-pay | Admitting: Neurosurgery

## 2013-04-24 DIAGNOSIS — R9389 Abnormal findings on diagnostic imaging of other specified body structures: Secondary | ICD-10-CM

## 2013-05-14 ENCOUNTER — Ambulatory Visit (HOSPITAL_COMMUNITY): Payer: No Typology Code available for payment source

## 2013-05-21 ENCOUNTER — Ambulatory Visit (HOSPITAL_COMMUNITY)
Admission: RE | Admit: 2013-05-21 | Discharge: 2013-05-21 | Disposition: A | Discharge status: Home / Self Care | Source: Ambulatory Visit | Attending: Neurosurgery | Admitting: Neurosurgery

## 2013-05-21 DIAGNOSIS — R9389 Abnormal findings on diagnostic imaging of other specified body structures: Secondary | ICD-10-CM

## 2013-05-21 DIAGNOSIS — M949 Disorder of cartilage, unspecified: Principal | ICD-10-CM

## 2013-05-21 DIAGNOSIS — M899 Disorder of bone, unspecified: Secondary | ICD-10-CM | POA: Insufficient documentation

## 2014-12-24 IMAGING — CT CT CHEST W/O CM
2 of 3 series · 15 of 36 positions shown, 18 images · non-contrast
Comparison: 02/12/2013

CLINICAL DATA: Nodular density peripherally at the right lung base
on chest radiography.

EXAM:
CT CHEST WITHOUT CONTRAST
TECHNIQUE: Multidetector CT imaging of the chest was performed following the
standard protocol without IV contrast.

[Series 2: thorax 5.0 i31f 1 · axial · 0.62mm/px · z∈[+949,+1219]mm · 12 of 64 slices shown, 15 images]
[im 5/64  mediastinal]
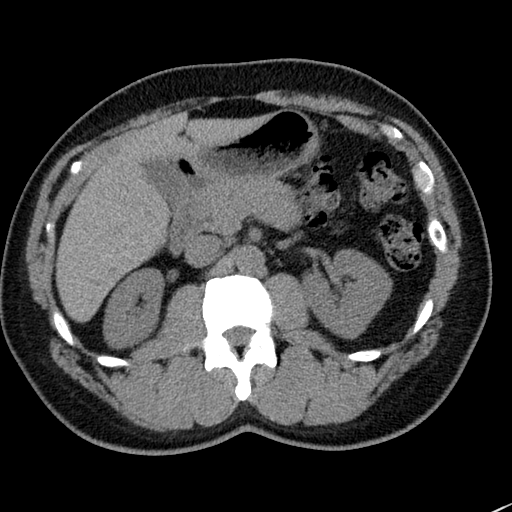
[im 5/64  lung]
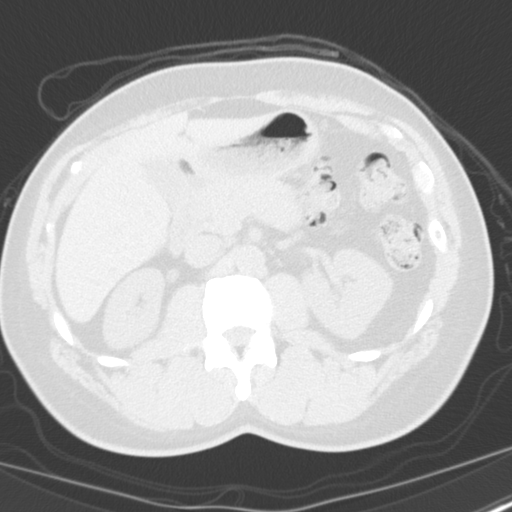
[im 10/64  lung]
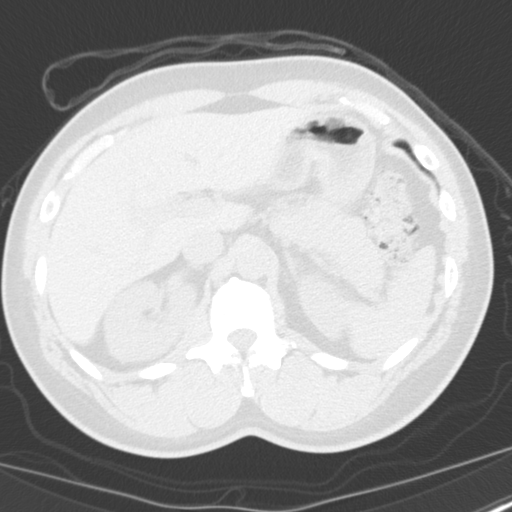
[im 15/64  lung]
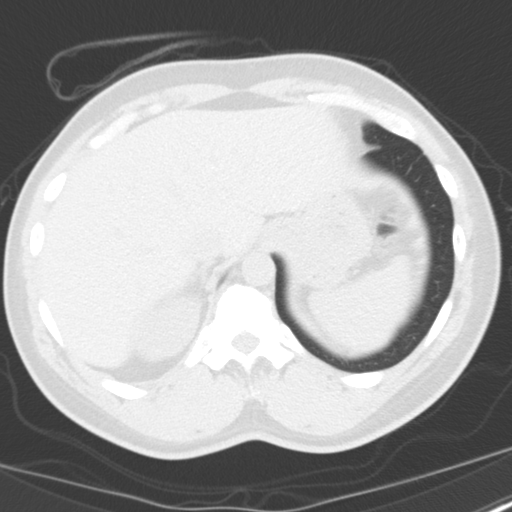
[im 19/64  lung]
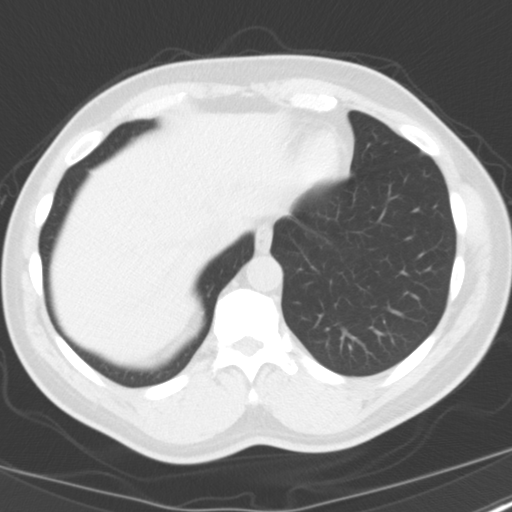
[im 24/64  mediastinal]
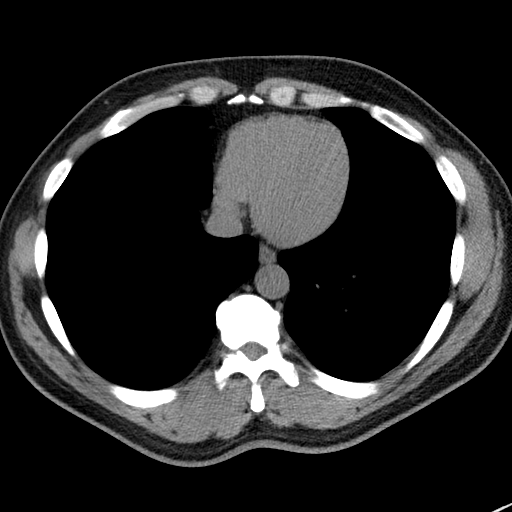
[im 24/64  lung]
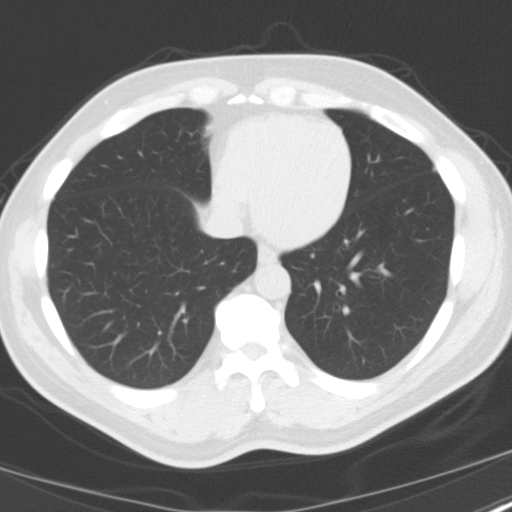
[im 29/64  lung]
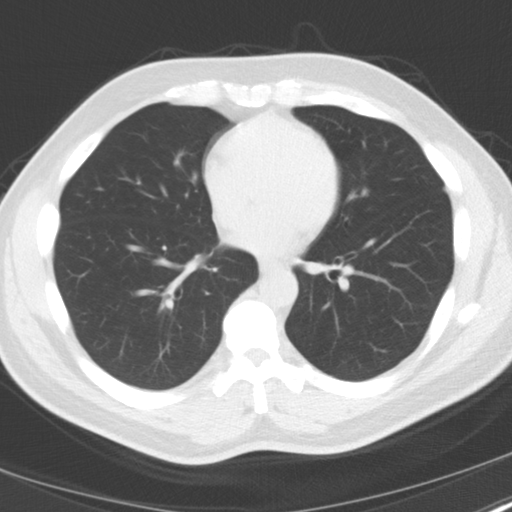
[im 36/64  lung]
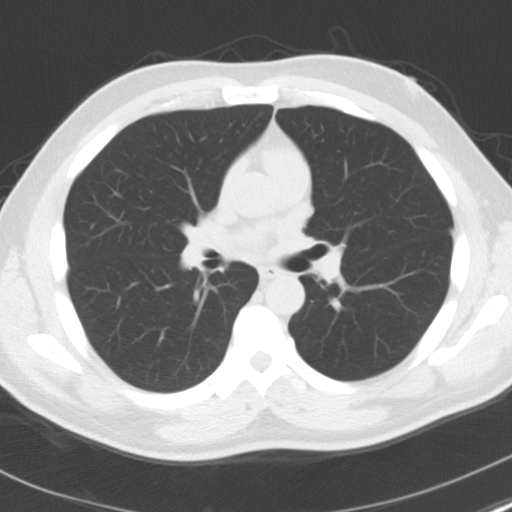
[im 40/64  lung]
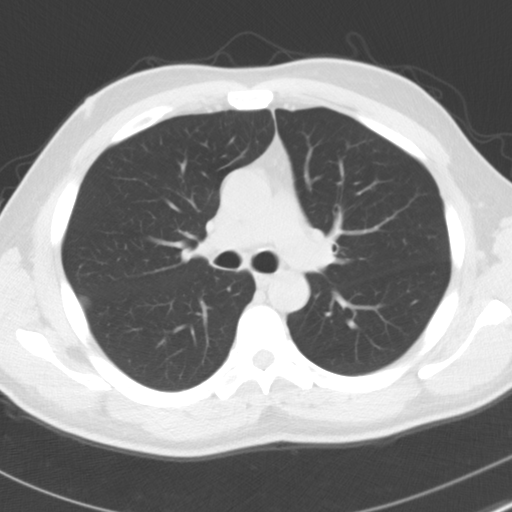
[im 45/64  mediastinal]
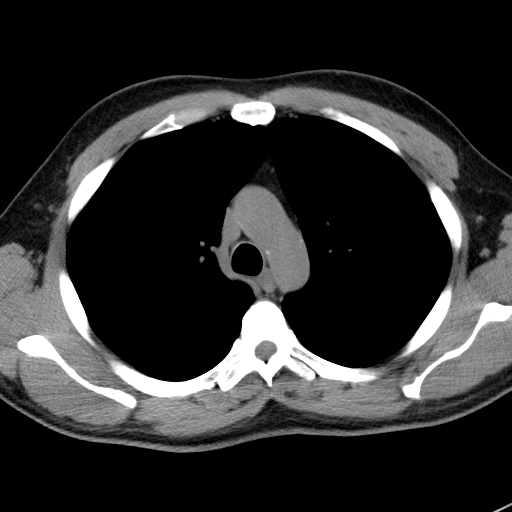
[im 45/64  lung]
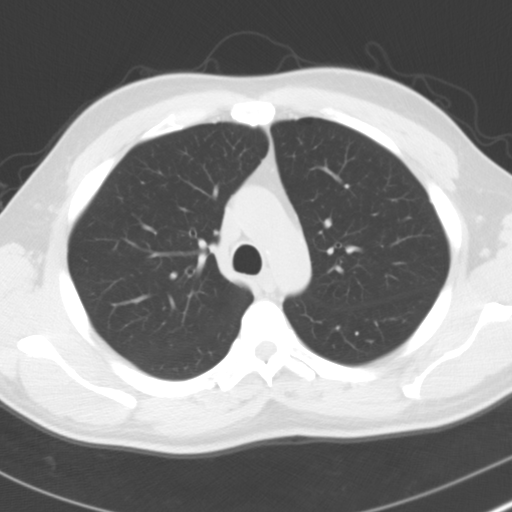
[im 50/64  lung]
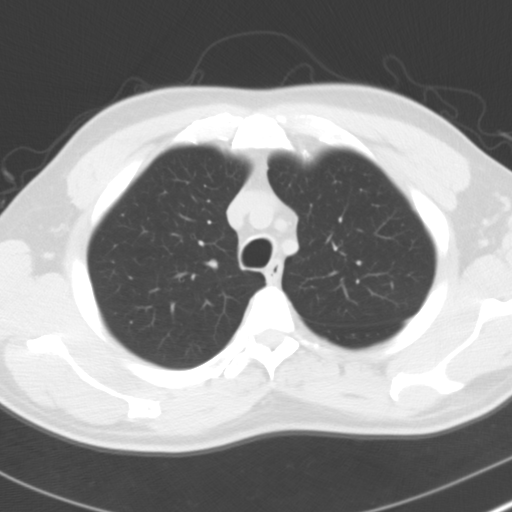
[im 54/64  lung]
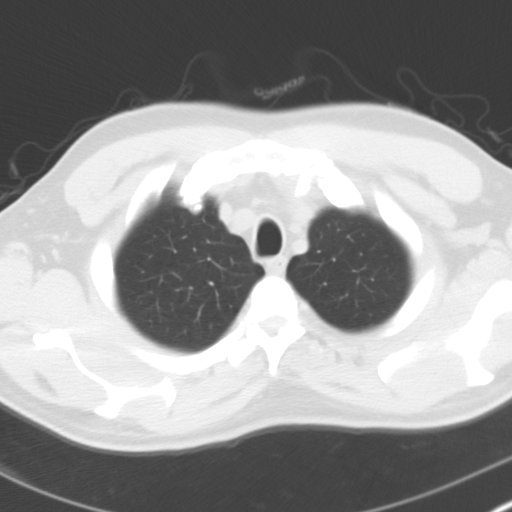
[im 59/64  lung]
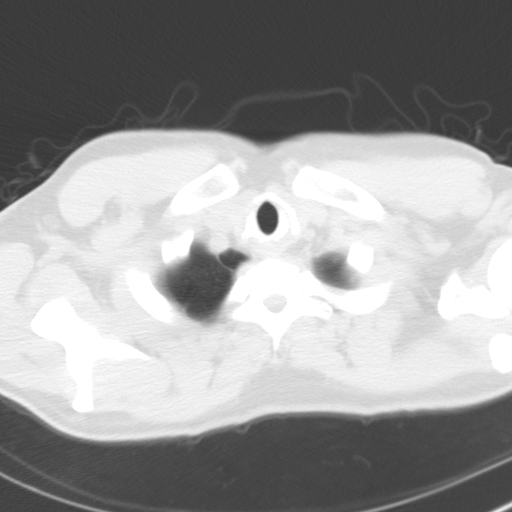

[Series 5: coronal · coronal · 0.57mm/px · 3 of 73 slices shown]
[im 15/73  lung]
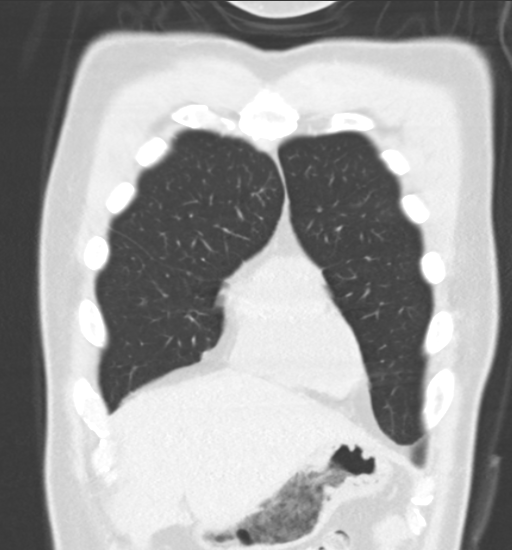
[im 29/73  lung]
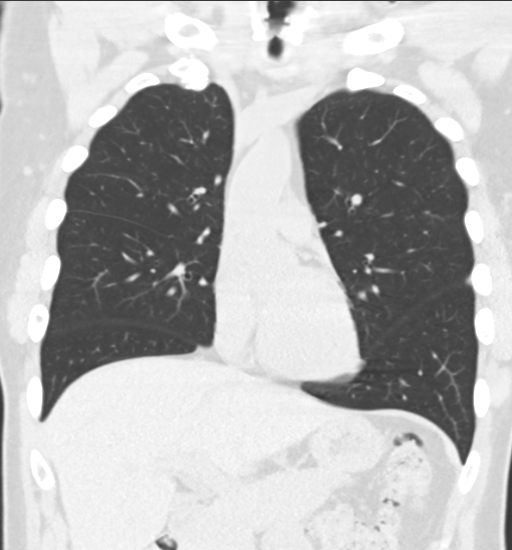
[im 44/73  lung]
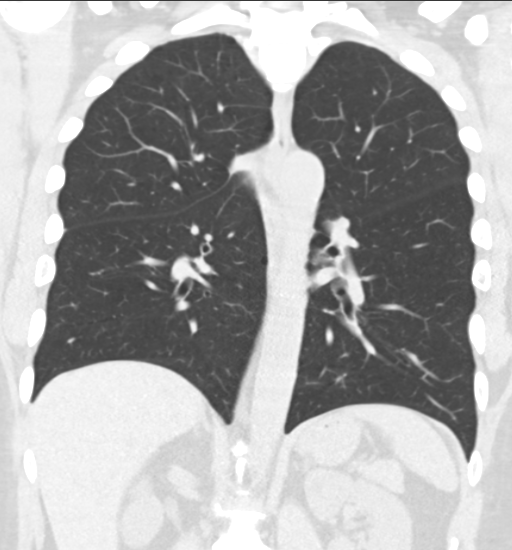

[15 of 36 positions shown; findings below may reference images not displayed]

FINDINGS: The density on chest radiography turns out to be due to a 4.1 x
by 1.2 cm right 7th rib lesions with some mild medial expansion of
the rib. This lesion is mixed density, mainly sclerotic but with a
1.0 x 0.7 x 0.7 cm lucent component medially. Linear and oval-shaped
sclerotic components within the lucent portion noted.

No other significant bony lesion is observed in the thorax. The
lungs appear clear. No significant vascular abnormality observed.
Contour of the visualized portion of the kidneys normal.
IMPRESSION: 1. Finding on conventional radiography reflect a medial right 7th
rib lesion. I favor this as representing a benign rib enchondroma
although there is a greater degree of peripheral sclerosis than is
often encountered. There is some endosteal scalloping, expansion,
and a lucent component with some ring-like internal calcification.
Imaging findings are suggestive of enchondroma, but not entirely
specific, and accordingly this lesion merits followup to exclude the
type of progression which might indicate a rib metastatic lesion. I
recommend getting a PSA level now, and assuming it is normal,
followup chest CT in 3-6 months time to exclude progression. If the
PSA level is elevated thinned further workup of the prostate would
be recommended.
